# Patient Record
Sex: Female | Born: 1955 | Race: White | Hispanic: No | Smoking: Never smoker
Health system: Southern US, Community
[De-identification: ages and names within clinical notes are randomized; demographics above are authoritative.]

## PROBLEM LIST (undated history)

## (undated) DIAGNOSIS — I10 Essential (primary) hypertension: Secondary | ICD-10-CM

## (undated) DIAGNOSIS — E7800 Pure hypercholesterolemia, unspecified: Secondary | ICD-10-CM

## (undated) DIAGNOSIS — Z902 Acquired absence of lung [part of]: Secondary | ICD-10-CM

## (undated) DIAGNOSIS — F329 Major depressive disorder, single episode, unspecified: Secondary | ICD-10-CM

## (undated) DIAGNOSIS — I313 Pericardial effusion (noninflammatory): Secondary | ICD-10-CM

## (undated) DIAGNOSIS — I3139 Other pericardial effusion (noninflammatory): Secondary | ICD-10-CM

## (undated) DIAGNOSIS — M810 Age-related osteoporosis without current pathological fracture: Secondary | ICD-10-CM

## (undated) DIAGNOSIS — C3490 Malignant neoplasm of unspecified part of unspecified bronchus or lung: Secondary | ICD-10-CM

## (undated) DIAGNOSIS — M419 Scoliosis, unspecified: Secondary | ICD-10-CM

## (undated) DIAGNOSIS — R9431 Abnormal electrocardiogram [ECG] [EKG]: Secondary | ICD-10-CM

## (undated) DIAGNOSIS — Z9889 Other specified postprocedural states: Secondary | ICD-10-CM

## (undated) DIAGNOSIS — F32A Depression, unspecified: Secondary | ICD-10-CM

## (undated) DIAGNOSIS — R911 Solitary pulmonary nodule: Secondary | ICD-10-CM

## (undated) HISTORY — PX: ENDOMETRIAL ABLATION: SHX621

## (undated) HISTORY — DX: Pericardial effusion (noninflammatory): I31.3

## (undated) HISTORY — PX: PLACEMENT OF BREAST IMPLANTS: SHX6334

## (undated) HISTORY — DX: Solitary pulmonary nodule: R91.1

## (undated) HISTORY — DX: Acquired absence of lung (part of): Z90.2

## (undated) HISTORY — DX: Other specified postprocedural states: Z98.890

## (undated) HISTORY — DX: Major depressive disorder, single episode, unspecified: F32.9

## (undated) HISTORY — PX: LUNG LOBECTOMY: SHX167

## (undated) HISTORY — DX: Other pericardial effusion (noninflammatory): I31.39

## (undated) HISTORY — DX: Pure hypercholesterolemia, unspecified: E78.00

## (undated) HISTORY — DX: Depression, unspecified: F32.A

## (undated) HISTORY — DX: Scoliosis, unspecified: M41.9

## (undated) HISTORY — DX: Essential (primary) hypertension: I10

## (undated) HISTORY — DX: Age-related osteoporosis without current pathological fracture: M81.0

## (undated) HISTORY — DX: Abnormal electrocardiogram (ECG) (EKG): R94.31

## (undated) HISTORY — DX: Malignant neoplasm of unspecified part of unspecified bronchus or lung: C34.90

---

## 2001-09-16 HISTORY — PX: AUGMENTATION MAMMAPLASTY: SUR837

## 2017-07-22 ENCOUNTER — Telehealth: Payer: Self-pay | Admitting: *Deleted

## 2017-07-22 NOTE — Telephone Encounter (Signed)
Medical record received from [facility] for pt's upcoming appt  with Dr. Martinique on 08/01/17. Records given to [facility].

## 2017-07-22 NOTE — Telephone Encounter (Signed)
Medical records received from [facility] for upcoming appt.on 11.16.18 . Medical records given to [facility].

## 2017-07-23 ENCOUNTER — Encounter: Payer: Self-pay | Admitting: Internal Medicine

## 2017-07-23 ENCOUNTER — Telehealth: Payer: Self-pay | Admitting: Cardiology

## 2017-07-23 ENCOUNTER — Ambulatory Visit (INDEPENDENT_AMBULATORY_CARE_PROVIDER_SITE_OTHER): Payer: BLUE CROSS/BLUE SHIELD | Admitting: Internal Medicine

## 2017-07-23 ENCOUNTER — Other Ambulatory Visit (INDEPENDENT_AMBULATORY_CARE_PROVIDER_SITE_OTHER): Payer: BLUE CROSS/BLUE SHIELD

## 2017-07-23 VITALS — BP 126/84 | HR 68 | Ht 64.5 in | Wt 130.6 lb

## 2017-07-23 DIAGNOSIS — R058 Other specified cough: Secondary | ICD-10-CM | POA: Insufficient documentation

## 2017-07-23 DIAGNOSIS — R05 Cough: Secondary | ICD-10-CM

## 2017-07-23 DIAGNOSIS — R0609 Other forms of dyspnea: Secondary | ICD-10-CM | POA: Diagnosis not present

## 2017-07-23 LAB — CBC WITH DIFFERENTIAL/PLATELET
BASOS ABS: 0 10*3/uL (ref 0.0–0.1)
BASOS PCT: 0.9 % (ref 0.0–3.0)
EOS ABS: 0.1 10*3/uL (ref 0.0–0.7)
Eosinophils Relative: 1.2 % (ref 0.0–5.0)
HCT: 42.4 % (ref 36.0–46.0)
Hemoglobin: 13.7 g/dL (ref 12.0–15.0)
LYMPHS ABS: 1.7 10*3/uL (ref 0.7–4.0)
LYMPHS PCT: 32.4 % (ref 12.0–46.0)
MCHC: 32.4 g/dL (ref 30.0–36.0)
MCV: 92.8 fl (ref 78.0–100.0)
MONO ABS: 0.4 10*3/uL (ref 0.1–1.0)
Monocytes Relative: 8.3 % (ref 3.0–12.0)
NEUTROS ABS: 3.1 10*3/uL (ref 1.4–7.7)
NEUTROS PCT: 57.2 % (ref 43.0–77.0)
PLATELETS: 242 10*3/uL (ref 150.0–400.0)
RBC: 4.57 Mil/uL (ref 3.87–5.11)
RDW: 12.8 % (ref 11.5–15.5)
WBC: 5.4 10*3/uL (ref 4.0–10.5)

## 2017-07-23 MED ORDER — PANTOPRAZOLE SODIUM 40 MG PO TBEC: 40.0000 mg | DELAYED_RELEASE_TABLET | Freq: Every day | ORAL | 2 refills | Status: DC

## 2017-07-23 MED ORDER — FAMOTIDINE 20 MG PO TABS: ORAL_TABLET | ORAL | 2 refills | Status: DC

## 2017-07-23 NOTE — Progress Notes (Signed)
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   07/23/2017 1st [facility] office visit/ Christine Hatfield   Chief Complaint  Patient presents with  . Pulmonary Consult    Referred by Christine Lento, NP. Pt has hx of lung cancer and had left lower lobectomy in Feb 2016. She c/o DOE with activity such as cleaning her house. She clears her throat often and her voice gets hoarse.   MMRC1 = can walk nl pace, flat grade, can't hurry or go uphills or steps s sob   Sense of pnds ever since surgery 24/7 some better noct but also wakes her up  On fosfamax for > 3 years with overt HB  No obvious day to day or daytime variability or assoc excess/ purulent sputum or mucus plugs or hemoptysis or cp or chest tightness, subjective wheeze or overt sinus   symptoms. No unusual exp hx or h/o childhood pna/ asthma or knowledge of premature birth.    Also denies any obvious fluctuation of symptoms with weather or environmental changes or other aggravating or alleviating factors except as outlined above   Current Allergies, Complete Past Medical History, Past Surgical History, Family History, and Social History were reviewed in [employer] record.  ROS  The following are not active complaints unless bolded Hoarseness, sore throat, dysphagia, dental problems, itching, sneezing,  nasal congestion or discharge of excess mucus or purulent secretions, ear ache,   fever, chills, sweats, unintended wt loss or wt gain, classically pleuritic or exertional cp,  orthopnea pnd or leg swelling, presyncope, palpitations, abdominal pain, anorexia, nausea,  vomiting, diarrhea  or change in bowel habits or change in bladder habits, change in stools or change in urine, dysuria, hematuria,  rash, arthralgias, visual complaints, headache, numbness, weakness or ataxia or problems with walking or coordination,  change in mood/affect or memory.        Current Meds  Medication Sig  . amLODipine (NORVASC) 5 MG tablet Take 1 tablet daily by mouth.  . sertraline (ZOLOFT) 50 MG tablet Take 1 tablet daily by mouth.  . [  alendronate (FOSAMAX) 70 MG tablet Take 1 tablet daily by mouth.               Review of Systems     Objective:   Physical Exam    amb wf with freq throat clearing   Wt Readings from Last 3 Encounters:  07/23/17 130 lb 9.6 oz (59.2 kg)    Vital signs reviewed - Note on arrival 02 sats  99% on RA      HEENT: nl dentition, turbinates bilaterally, and oropharynx which is pristine.  Nl external ear canals without cough reflex   NECK :  without JVD/Nodes/TM/ nl carotid upstrokes bilaterally   LUNGS: no acc muscle use,  Nl contour chest which is clear to A and P bilaterally without cough on insp or exp maneuvers   CV:  RRR  no s3 or murmur or increase in P2, and no edema   ABD:  soft and nontender with nl inspiratory  excursion in the supine position. No bruits or organomegaly appreciated, bowel sounds nl  MS:  Nl gait/ ext warm without deformities, calf tenderness, cyanosis or clubbing No obvious joint restrictions   SKIN: warm and dry without lesions    NEURO:  alert, approp, nl sensorium with  no motor or cerebellar deficits apparent.     Labs ordered 07/23/2017    Allergy profile      Assessment:

## 2017-07-23 NOTE — Patient Instructions (Addendum)
Try off  fosfamax for 3 months duration to judge full effect   Pantoprazole (protonix) 40 mg   Take  30-60 min before first meal of the day and Pepcid (famotidine)  20 mg one @  bedtime until return to office - this is the best way to tell whether stomach acid is contributing to your problem.    GERD (REFLUX)  is an extremely common cause of respiratory symptoms just like yours , many times with no obvious heartburn at all.    It can be treated with medication, but also with lifestyle changes including elevation of the head of your bed (ideally with 6 inch  bed blocks),  Smoking cessation, avoidance of late meals, excessive alcohol, and avoid fatty foods, chocolate, peppermint, colas, red wine, and acidic juices such as orange juice.  NO MINT OR MENTHOL PRODUCTS SO NO COUGH DROPS   USE SUGARLESS CANDY INSTEAD (Jolley ranchers or Stover's or Life Savers) or even ice chips will also do - the key is to swallow to prevent all throat clearing. NO OIL BASED VITAMINS - use powdered substitutes.   For drainage / throat tickle try take CHLORPHENIRAMINE  4 mg - take one every 4 hours as needed - available over the counter- may cause drowsiness so start with just a bedtime dose or two and see how you tolerate it before trying in daytime     Please remember to go to the lab department downstairs in the basement  for your tests - we will call you with the results when they are available.      Please schedule a follow up office visit in 4  weeks, call sooner if needed -add spirometry on return/ consider gabapentin trial next if not better

## 2017-07-23 NOTE — Telephone Encounter (Signed)
07/24/2017 Received faxed referral from [facility] for upcoming appointment with Dr. Martinique on 08/01/2017.  Records given to [facility]. cbr

## 2017-07-24 ENCOUNTER — Encounter: Payer: Self-pay | Admitting: Internal Medicine

## 2017-07-24 DIAGNOSIS — R0609 Other forms of dyspnea: Secondary | ICD-10-CM | POA: Insufficient documentation

## 2017-07-24 LAB — RESPIRATORY ALLERGY PROFILE REGION II ~~LOC~~
Allergen, A. alternata, m6: <0.1 kU/L
Allergen, Comm Silver Birch, t9: <0.1 kU/L
Allergen, D pternoyssinus,d7: <0.1 kU/L
Allergen, P. notatum, m1: <0.1 kU/L
BERMUDA GRASS: 2.5 kU/L — AB
Box Elder IgE: <0.1 kU/L
CLASS: 0
CLASS: 0
CLASS: 0
CLASS: 0
CLASS: 0
CLASS: 0
CLASS: 0
CLASS: 0
CLASS: 0
CLASS: 2
CLASS: 2
COMMON RAGWEED (SHORT) (W1) IGE: 5.75 kU/L — ABNORMAL HIGH
Class: 0
Class: 0
Class: 0
Class: 0
Class: 0
Class: 0
Class: 0
Class: 0
Class: 0
Class: 0
Class: 0
Class: 3
Class: 3
D. farinae: <0.1 kU/L
DOG DANDER: 0.17 kU/L — AB
Elm IgE: <0.1 kU/L
IgE (Immunoglobulin E), Serum: 48 kU/L (ref <=114)
Johnson Grass: 2.46 kU/L — ABNORMAL HIGH
Pecan/Hickory Tree IgE: <0.1 kU/L
TIMOTHY GRASS: 7.47 kU/L — AB

## 2017-07-24 LAB — INTERPRETATION:

## 2017-07-24 NOTE — Progress Notes (Signed)
Spoke with pt and notified of results per Dr. Wert. Pt verbalized understanding and denied any questions. 

## 2017-07-24 NOTE — Assessment & Plan Note (Signed)
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

## 2017-07-24 NOTE — Assessment & Plan Note (Addendum)
Allergy profile 07/23/2017 >  Eos 0.1 /  IgE  Pending  - 07/23/2017 trial off fosfamax/ max gerd rx   The most common causes of chronic cough in immunocompetent adults include the following: upper airway cough syndrome (UACS), previously referred to as postnasal drip syndrome (PNDS), which is caused by variety of rhinosinus conditions; (2) asthma; (3) GERD; (4) chronic bronchitis from cigarette smoking or other inhaled environmental irritants; (5) nonasthmatic eosinophilic bronchitis; and (6) bronchiectasis.   These conditions, singly or in combination, have accounted for up to 94% of the causes of chronic cough in prospective studies.   Other conditions have constituted no >6% of the causes in prospective studies These have included bronchogenic carcinoma, chronic interstitial pneumonia, sarcoidosis, left ventricular failure, ACEI-induced cough, and aspiration from a condition associated with pharyngeal dysfunction.    Chronic cough is often simultaneously caused by more than one condition. A single cause has been found from 38 to 82% of the time, multiple causes from 18 to 62%. Multiply caused cough has been the result of three diseases up to 42% of the time.      Upper airway cough syndrome (previously labeled PNDS),  is so named because it's frequently impossible to sort out how much is  CR/sinusitis with freq throat clearing (which can be related to primary GERD)   vs  causing  secondary (" extra esophageal")  GERD from wide swings in gastric pressure that occur with throat clearing, often  promoting self use of mint and menthol lozenges that reduce the lower esophageal sphincter tone and exacerbate the problem further in a cyclical fashion.   These are the same pts (now being labeled as having "irritable larynx syndrome" by some cough centers) who not infrequently have a history of having failed to tolerate ace inhibitors,  dry powder inhalers or biphosphonates or report having  atypical/extraesophageal reflux symptoms that don't respond to standard doses of PPI  and are easily confused as having aecopd or asthma flares by even experienced allergists/ pulmonologists (myself included).   Of the three most common causes of  Sub-acute or recurrent or chronic cough, only one (GERD)  can actually contribute to/ trigger  the other two (asthma and post nasal drip syndrome)  and perpetuate the cylce of cough.  While not intuitively obvious, many patients with chronic low grade reflux do not cough until there is a primary insult that disturbs the protective epithelial barrier and exposes sensitive nerve endings.   This is typically viral but can be direct physical injury such as with an endotracheal tube.   The point is that once this occurs, it is difficult to eliminate the cycle  using anything but a maximally effective acid suppression regimen at least in the short run, accompanied by an appropriate diet to address non acid GERD and control and add 1st gen H1 blockers per guidelines .   If not better on return next step is trial of gabapentin.   Total time devoted to counseling  > 50 % of initial 60 min office visit:  review case with pt/ discussion of options/alternatives/ personally creating written customized instructions  in presence of pt  then going over those specific  Instructions directly with the pt including how to use all of the meds but in particular covering each new medication in detail and the difference between the maintenance= "automatic" meds and the prns using an action plan format for the latter (If this problem/symptom => do that organization reading Left to right).  Please see AVS from this visit for a full list of these instructions which I personally wrote for this pt and  are unique to this visit.

## 2017-07-28 ENCOUNTER — Other Ambulatory Visit: Payer: Self-pay | Admitting: Registered Nurse

## 2017-07-28 DIAGNOSIS — Z1231 Encounter for screening mammogram for malignant neoplasm of breast: Secondary | ICD-10-CM
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
## 2017-07-30 ENCOUNTER — Encounter: Payer: Self-pay | Admitting: *Deleted

## 2017-08-01 ENCOUNTER — Encounter: Payer: Self-pay | Admitting: Cardiology

## 2017-08-01 ENCOUNTER — Ambulatory Visit (INDEPENDENT_AMBULATORY_CARE_PROVIDER_SITE_OTHER): Payer: BLUE CROSS/BLUE SHIELD | Admitting: Cardiology

## 2017-08-01 VITALS — BP 148/88 | HR 68 | Ht 64.0 in | Wt 134.2 lb

## 2017-08-01 DIAGNOSIS — I3139 Other pericardial effusion (noninflammatory): Secondary | ICD-10-CM

## 2017-08-01 DIAGNOSIS — I313 Pericardial effusion (noninflammatory): Secondary | ICD-10-CM | POA: Diagnosis not present

## 2017-08-01 DIAGNOSIS — I10 Essential (primary) hypertension: Secondary | ICD-10-CM | POA: Diagnosis not present

## 2017-08-01 NOTE — Patient Instructions (Signed)
Schedule Echocardiogram

## 2017-08-05 ENCOUNTER — Ambulatory Visit (HOSPITAL_COMMUNITY): Payer: BLUE CROSS/BLUE SHIELD | Attending: Cardiology

## 2017-08-05 ENCOUNTER — Other Ambulatory Visit: Payer: Self-pay

## 2017-08-05 DIAGNOSIS — I10 Essential (primary) hypertension: Secondary | ICD-10-CM

## 2017-08-05 DIAGNOSIS — E785 Hyperlipidemia, unspecified: Secondary | ICD-10-CM | POA: Diagnosis not present

## 2017-08-05 DIAGNOSIS — I313 Pericardial effusion (noninflammatory): Secondary | ICD-10-CM | POA: Diagnosis present

## 2017-08-05 DIAGNOSIS — I3139 Other pericardial effusion (noninflammatory): Secondary | ICD-10-CM

## 2017-08-20 ENCOUNTER — Ambulatory Visit (INDEPENDENT_AMBULATORY_CARE_PROVIDER_SITE_OTHER): Payer: BLUE CROSS/BLUE SHIELD | Admitting: Internal Medicine

## 2017-08-20 ENCOUNTER — Encounter: Payer: Self-pay | Admitting: Internal Medicine

## 2017-08-20 VITALS — BP 130/86 | HR 68 | Ht 64.0 in | Wt 133.0 lb

## 2017-08-20 DIAGNOSIS — R05 Cough: Secondary | ICD-10-CM

## 2017-08-20 DIAGNOSIS — R058 Other specified cough: Secondary | ICD-10-CM

## 2017-08-20 NOTE — Progress Notes (Signed)
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   History of Present Illness  07/23/2017 1st [facility] office visit/ Beaux Wedemeyer   Chief Complaint  Patient presents with  . Pulmonary Consult    Referred by Aldona Lento, NP. Pt has hx of lung cancer and had left lower lobectomy in Feb 2016. She c/o DOE with activity such as cleaning her house. She clears her throat often and her voice gets hoarse.   MMRC1 = can walk nl pace, flat grade, can't hurry or go uphills or steps s sob   Sense of pnds ever since surgery 24/7 some better noct but also wakes her up  On fosfamax for > 3 years with overt HB rec Try off  fosfamax for 3 months duration to judge full effect  Pantoprazole (protonix) 40 mg   Take  30-60 min before first meal of the day and Pepcid (famotidine)  20 mg one @  bedtime until return to office - this is the best way to tell whether stomach acid is contributing to your problem.   GERD. For drainage / throat tickle try take CHLORPHENIRAMINE  4 mg - take one every 4 hours as needed - available over the counter- may cause drowsiness so start with just a bedtime dose or two and see how you tolerate it before trying in daytime   -add spirometry on return/ consider gabapentin trial next if not better       08/20/2017  f/u ov/Kamyla Olejnik re: uacs ? Related to fosfamax  Chief Complaint  Patient presents with  . Follow-up    Breathing has improved and she is having less cough and PND.    improving ex tol including working out/ cough is much better Take h1 hs and pepcid hs and never started ppi / no fosfamax  since last ov   No obvious day to day or daytime variability or assoc excess/ purulent sputum or mucus plugs or hemoptysis or cp or chest tightness, subjective wheeze or overt sinus or hb symptoms. No unusual exposure hx or h/o childhood pna/ asthma or knowledge of premature birth.  Sleeping ok flat without nocturnal  or early am exacerbation  of respiratory  c/o's or need for noct saba. Also denies any obvious fluctuation of symptoms with weather or environmental changes or other aggravating or alleviating factors except as outlined above   Current Allergies, Complete Past Medical History, Past Surgical History, Family History, and Social History were reviewed in [employer] [facility] record.  ROS  The following are not active complaints unless bolded Hoarseness, sore throat, dysphagia, dental problems, itching, sneezing,  nasal congestion or discharge of excess mucus or purulent secretions, ear ache,   fever, chills, sweats, unintended wt loss or wt gain, classically pleuritic or exertional cp,  orthopnea pnd or leg swelling, presyncope, palpitations, abdominal pain, anorexia, nausea, vomiting, diarrhea  or change in bowel habits or change in bladder habits, change in stools or change in urine, dysuria, hematuria,  rash, arthralgias, visual complaints, headache, numbness, weakness  or ataxia or problems with walking or coordination,  change in mood/affect or memory.        Current Meds  Medication Sig  . amLODipine (NORVASC) 5 MG tablet Take 1 tablet daily by mouth.  . chlorpheniramine (CHLOR-TRIMETON) 4 MG tablet Take 4 mg by mouth every 4 (four) hours as needed for allergies.  . famotidine (PEPCID) 20 MG tablet One at bedtime  . sertraline (ZOLOFT) 50 MG tablet Take 1 tablet daily by mouth.                 Objective:   Physical Exam        Wt Readings from Last 3 Encounters:  08/20/17 133 lb (60.3 kg)  08/01/17 134 lb 3.2 oz (60.9 kg)  07/23/17 130 lb 9.6 oz (59.2  kg)    Vital signs reviewed - Note on arrival 02 sats  95% on RA     amb wf nad/ all smiles/ no longer throat clearing at all     HEENT: nl dentition, turbinates bilaterally, and oropharynx. Nl external ear canals without cough reflex   NECK :  without JVD/Nodes/TM/ nl carotid upstrokes bilaterally   LUNGS: no acc muscle use,  Nl contour chest with minimal decrease bs at L base and mod T scoliosis   CV:  RRR  no s3 or murmur or increase in P2, and no edema   ABD:  soft and nontender with nl inspiratory excursion in the supine position. No bruits or organomegaly appreciated, bowel sounds nl  MS:  Nl gait/ ext warm without deformities, calf tenderness, cyanosis or clubbing No obvious joint restrictions   SKIN: warm and dry without lesions    NEURO:  alert, approp, nl sensorium with  no motor or cerebellar deficits apparent.        Assessment:

## 2017-08-20 NOTE — Patient Instructions (Addendum)
Based on your response to stopping fosfamax I would avoid this class of medications and monitor your bone density per guidelines with alternatives if you need treatment in the future =Prolia or Reclast    Return for unexplained cough or breathing difficulty as needed

## 2017-08-21 ENCOUNTER — Encounter: Payer: Self-pay | Admitting: Internal Medicine

## 2017-08-21 NOTE — Assessment & Plan Note (Addendum)
Allergy profile 07/23/2017 >  Eos 0.1 /  IgE  48  RAST pos grass, ragweed, dog > rec avoidance only  - 07/23/2017 trial off fosfamax/ max gerd rx > only uses h1 h2 hs and off fosfamax > cough resolved   Better to her satisfaction on minimal rx for gerd now that she's off fosfamax and so can probably d/c the hs h2 and just use prn 1st gen H1 blockers up to q 4h prn   I had an extended final summary discussion with the patient reviewing all relevant studies completed to date and  lasting 15 to 20 minutes of a 25 minute visit on the following issues:   Reviewed alternatives to oral biphosphonates and will need to resume surveillance for worsening bone density with prolia twice yearly or once yearly reclast reasonable alternatives  Moving to [facility] permanently this coming year so promised to set up f/u there.  If cough recurs should resume full gerd rx as prev rec x 6 weeks prior to re-eval by PCP or pulmonary in [place].  Pulmonary f/u is prn   Each maintenance medication was reviewed in detail including most importantly the difference between maintenance and as needed and under what circumstances the prns are to be used.  Please see AVS for specific  Instructions which are unique to this visit and I personally typed out  which were reviewed in detail in writing with the patient and a copy provided.

## 2017-08-26 ENCOUNTER — Ambulatory Visit: Payer: BLUE CROSS/BLUE SHIELD

## 2017-09-11 ENCOUNTER — Other Ambulatory Visit: Payer: Self-pay | Admitting: Internal Medicine

## 2017-09-11 DIAGNOSIS — R058 Other specified cough: Secondary | ICD-10-CM

## 2017-09-11 DIAGNOSIS — R05 Cough: Secondary | ICD-10-CM

## 2017-09-11 MED ORDER — FAMOTIDINE 20 MG PO TABS: ORAL_TABLET | ORAL | 0 refills | Status: AC

## 2017-09-19 ENCOUNTER — Other Ambulatory Visit: Payer: Self-pay | Admitting: Internal Medicine

## 2017-10-01 ENCOUNTER — Ambulatory Visit
Admission: RE | Admit: 2017-10-01 | Discharge: 2017-10-01 | Disposition: A | Discharge status: Home / Self Care | Payer: BLUE CROSS/BLUE SHIELD | Source: Ambulatory Visit | Attending: Registered Nurse | Admitting: Registered Nurse

## 2017-10-01 DIAGNOSIS — Z1231 Encounter for screening mammogram for malignant neoplasm of breast: Secondary | ICD-10-CM

## 2018-10-14 IMAGING — MG DIGITAL SCREENING BILATERAL MAMMOGRAM WITH IMPLANTS AND CAD
8 series · 8 of 8 positions shown · non-contrast
Comparison: Previous exam(s).

CLINICAL DATA: Screening.

EXAM:
DIGITAL SCREENING BILATERAL MAMMOGRAM WITH IMPLANTS AND CAD
The patient has retropectoral implants. Standard and implant
displaced views were performed.

[L CC (1 of 2)]
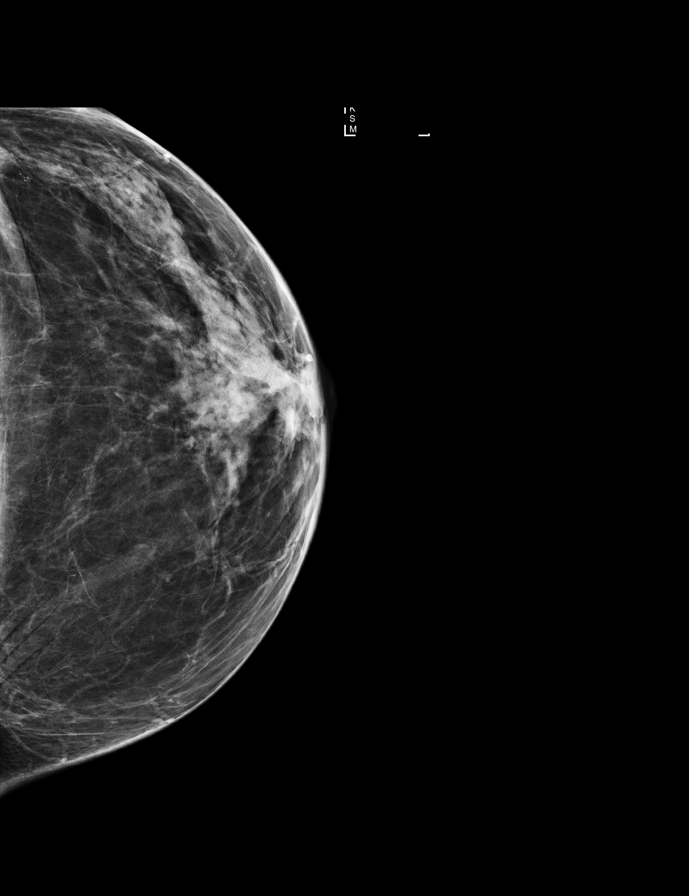

[R MLO (1 of 2)]
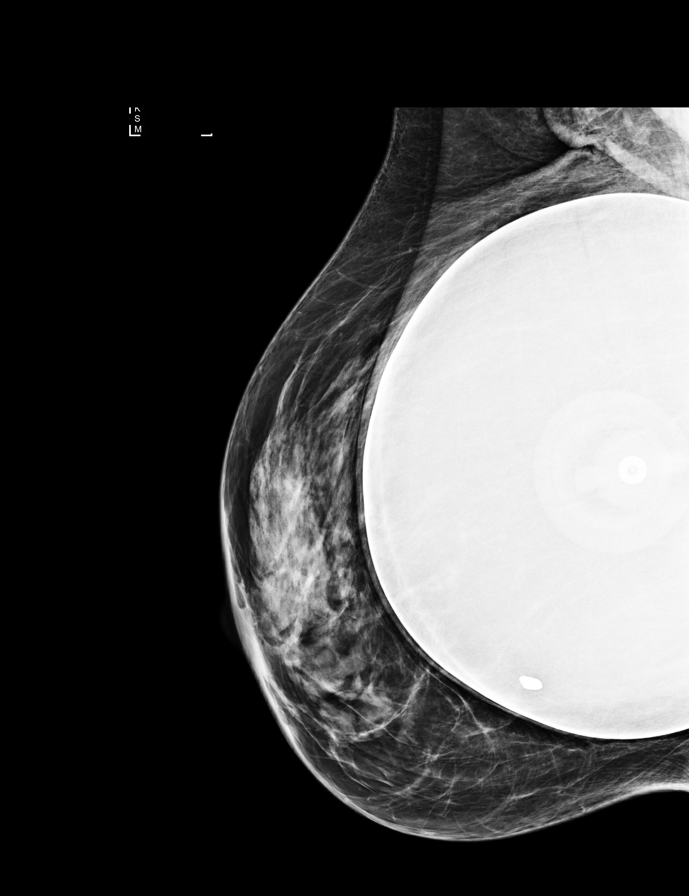

[L MLO (1 of 2)]
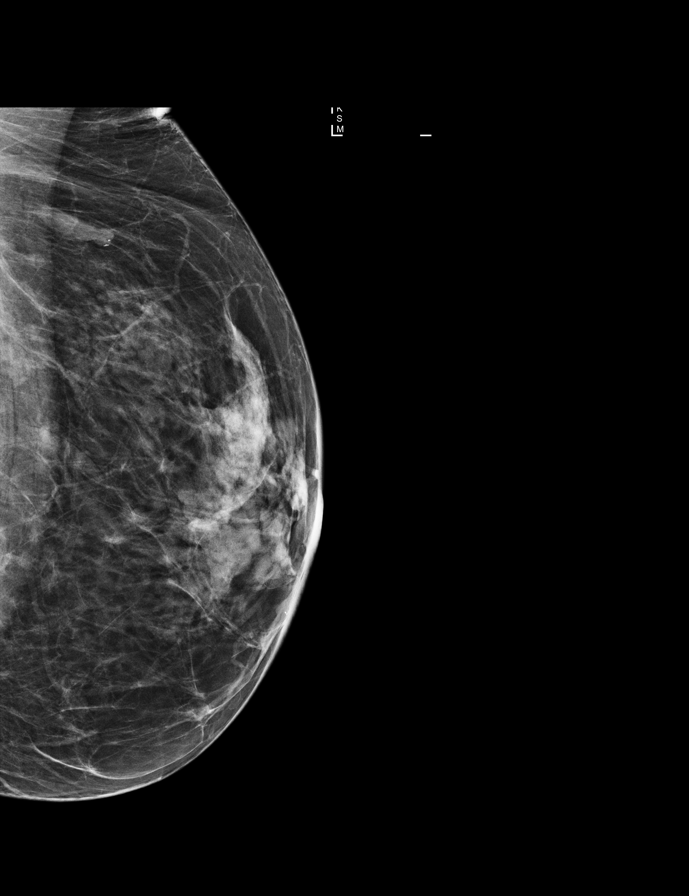

[L MLO (2 of 2)]
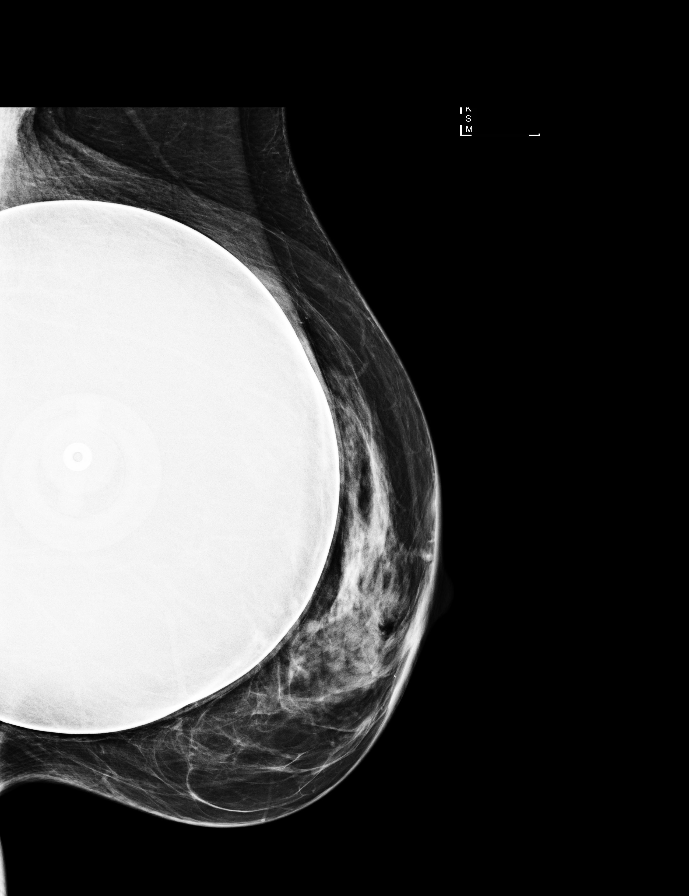

[L CC (2 of 2)]
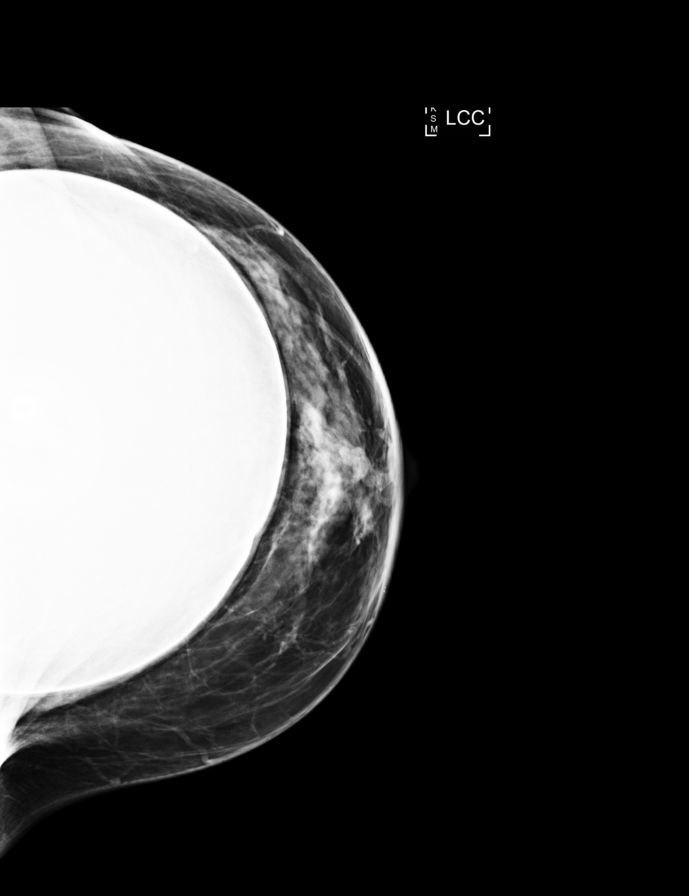

[R CC (1 of 2)]
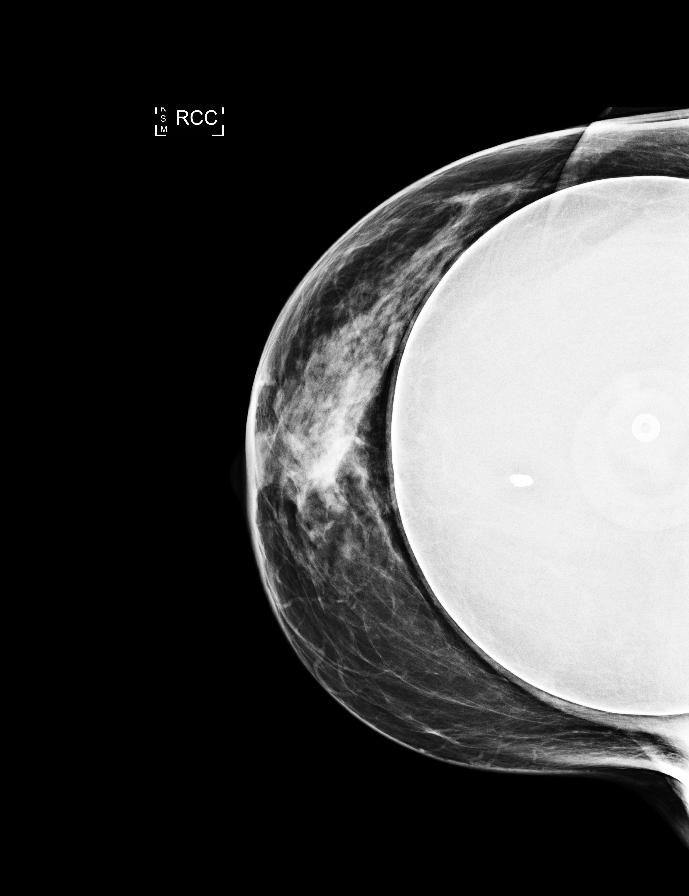

[R MLO (2 of 2)]
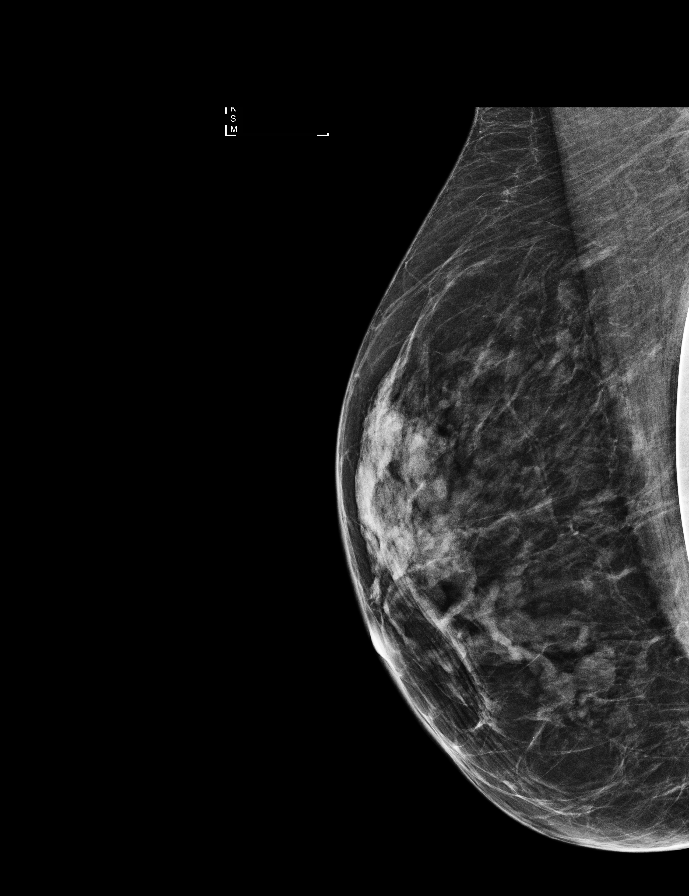

[R CC (2 of 2)]
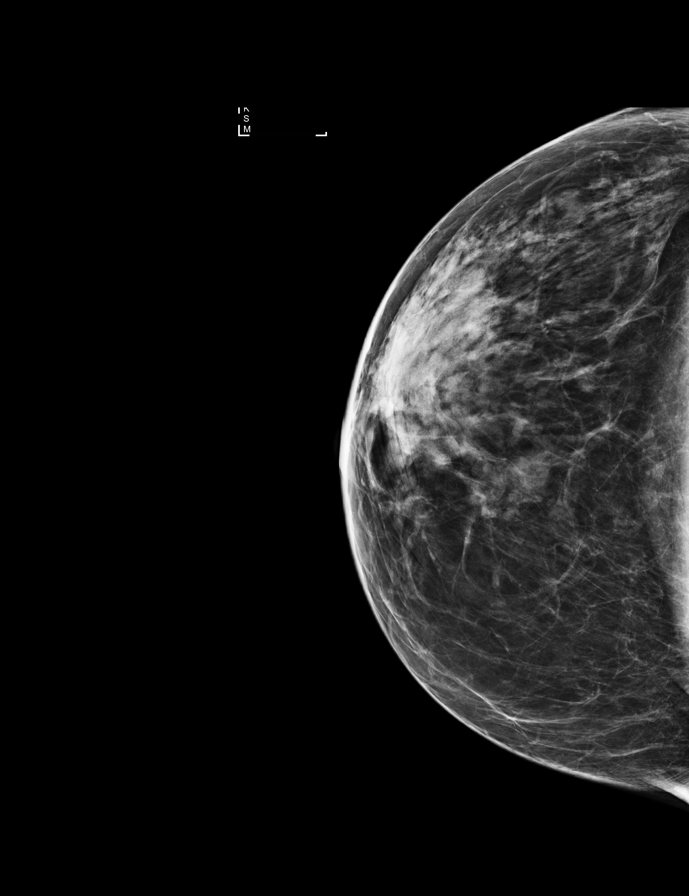

[8 of 8 positions shown; findings below may reference images not displayed]

ACR Breast Density Category c: The breast tissue is heterogeneously
dense, which may obscure small masses.
FINDINGS: There are no findings suspicious for malignancy. Images were
processed with CAD.
IMPRESSION: No mammographic evidence of malignancy. A result letter of this
screening mammogram will be mailed directly to the patient.

RECOMMENDATION:
Screening mammogram in one year. (Code:R9-8-RFM)

BI-RADS CATEGORY  1:  Negative.

## 2020-05-30 ENCOUNTER — Telehealth: Payer: Self-pay | Admitting: *Deleted

## 2020-05-30 NOTE — Telephone Encounter (Signed)
I received referral on Christine Hatfield.  I am unclear of the reason for referral.  I called her for clarification but was unable to reach her.  I did leave vm message with my name and phone number to call.

## 2020-06-02 ENCOUNTER — Telehealth: Payer: Self-pay | Admitting: *Deleted

## 2020-06-02 DIAGNOSIS — R911 Solitary pulmonary nodule: Secondary | ICD-10-CM

## 2020-06-02 NOTE — Telephone Encounter (Signed)
I received referral on Ms. Christine Hatfield. I called patient and updated her on appt time and place.  She verbalized understanding of appt.

## 2020-06-20 ENCOUNTER — Encounter: Payer: Self-pay | Admitting: Internal Medicine

## 2020-06-20 ENCOUNTER — Other Ambulatory Visit: Payer: Self-pay

## 2020-06-20 ENCOUNTER — Inpatient Hospital Stay: Payer: BLUE CROSS/BLUE SHIELD

## 2020-06-20 ENCOUNTER — Inpatient Hospital Stay: Payer: BLUE CROSS/BLUE SHIELD | Attending: Internal Medicine | Admitting: Internal Medicine

## 2020-06-20 DIAGNOSIS — C3492 Malignant neoplasm of unspecified part of left bronchus or lung: Secondary | ICD-10-CM

## 2020-06-20 DIAGNOSIS — Z803 Family history of malignant neoplasm of breast: Secondary | ICD-10-CM | POA: Insufficient documentation

## 2020-06-20 DIAGNOSIS — C3490 Malignant neoplasm of unspecified part of unspecified bronchus or lung: Secondary | ICD-10-CM

## 2020-06-20 DIAGNOSIS — E785 Hyperlipidemia, unspecified: Secondary | ICD-10-CM | POA: Insufficient documentation

## 2020-06-20 DIAGNOSIS — Z85118 Personal history of other malignant neoplasm of bronchus and lung: Secondary | ICD-10-CM | POA: Insufficient documentation

## 2020-06-20 DIAGNOSIS — Z902 Acquired absence of lung [part of]: Secondary | ICD-10-CM | POA: Insufficient documentation

## 2020-06-20 DIAGNOSIS — I10 Essential (primary) hypertension: Secondary | ICD-10-CM

## 2020-06-20 DIAGNOSIS — Z800 Family history of malignant neoplasm of digestive organs: Secondary | ICD-10-CM | POA: Insufficient documentation

## 2020-06-20 DIAGNOSIS — R059 Cough, unspecified: Secondary | ICD-10-CM | POA: Insufficient documentation

## 2020-06-20 DIAGNOSIS — F329 Major depressive disorder, single episode, unspecified: Secondary | ICD-10-CM | POA: Insufficient documentation

## 2020-06-20 DIAGNOSIS — Z79899 Other long term (current) drug therapy: Secondary | ICD-10-CM | POA: Insufficient documentation

## 2020-06-20 DIAGNOSIS — R911 Solitary pulmonary nodule: Secondary | ICD-10-CM

## 2020-06-20 LAB — CBC WITH DIFFERENTIAL (CANCER CENTER ONLY)
Abs Immature Granulocytes: 0.01 10*3/uL (ref 0.00–0.07)
Basophils Absolute: 0 10*3/uL (ref 0.0–0.1)
Basophils Relative: 1 %
Eosinophils Absolute: 0.1 10*3/uL (ref 0.0–0.5)
Eosinophils Relative: 1 %
HCT: 42.7 % (ref 36.0–46.0)
Hemoglobin: 13.9 g/dL (ref 12.0–15.0)
Immature Granulocytes: 0 %
Lymphocytes Relative: 30 %
Lymphs Abs: 1.5 10*3/uL (ref 0.7–4.0)
MCH: 29.5 pg (ref 26.0–34.0)
MCHC: 32.6 g/dL (ref 30.0–36.0)
MCV: 90.7 fL (ref 80.0–100.0)
Monocytes Absolute: 0.4 10*3/uL (ref 0.1–1.0)
Monocytes Relative: 8 %
Neutro Abs: 3 10*3/uL (ref 1.7–7.7)
Neutrophils Relative %: 60 %
Platelet Count: 252 10*3/uL (ref 150–400)
RBC: 4.71 MIL/uL (ref 3.87–5.11)
RDW: 11.9 % (ref 11.5–15.5)
WBC Count: 5.1 10*3/uL (ref 4.0–10.5)
nRBC: 0 % (ref 0.0–0.2)

## 2020-06-20 LAB — CMP (CANCER CENTER ONLY)
ALT: 14 U/L (ref 0–44)
AST: 20 U/L (ref 15–41)
Albumin: 4.3 g/dL (ref 3.5–5.0)
Alkaline Phosphatase: 66 U/L (ref 38–126)
Anion gap: 6 (ref 5–15)
BUN: 14 mg/dL (ref 8–23)
CO2: 31 mmol/L (ref 22–32)
Calcium: 10 mg/dL (ref 8.9–10.3)
Chloride: 102 mmol/L (ref 98–111)
Creatinine: 0.85 mg/dL (ref 0.44–1.00)
GFR, Estimated: >60 mL/min (ref >60)
Glucose, Bld: 79 mg/dL (ref 70–99)
Potassium: 3.9 mmol/L (ref 3.5–5.1)
Sodium: 139 mmol/L (ref 135–145)
Total Bilirubin: 0.5 mg/dL (ref 0.3–1.2)
Total Protein: 7.4 g/dL (ref 6.5–8.1)

## 2020-06-20 NOTE — Progress Notes (Signed)
[place] [phone]   [phone]  CONSULT NOTE  REFERRING PHYSICIAN: Dr. Christinia Gully  REASON FOR CONSULTATION:  64 years old white female with history of lung cancer.  HPI Christine Hatfield is a 64 y.o. female with past medical history significant for hypertension, dyslipidemia, osteoporosis, scoliosis, depression as well as spinal surgery and history of stage Ia non-small cell lung cancer, adenocarcinoma diagnosed during work-up for kidney stone in late 2015.  The patient had surgery in January 2016 including left lower lobectomy with lymph node sampling at [facility] in [place].  She did not receive any adjuvant systemic chemotherapy or radiation because of the early stage disease.  The patient moved to [place] and she was followed by medical oncology there and had repeat imaging studies at regular basis.  The last CT scan of the chest was performed on 03/27/2020 and it showed stable postsurgical changes from the left lower lobectomy without evidence for locally recurrent disease or definite evidence for metastatic disease.  There was development of a subcentimeter groundglass nodule measuring 7 mm within the posterior left upper lobe favored to be infectious/inflammatory in etiology but close follow-up was advised.  The patient moved recently to [facility] and she was seen by Dr. Melvyn Novas to establish care with a pulmonologist.  I referred her to me today for evaluation and recommendation regarding her history of lung cancer and also close monitoring of the new findings on the last scan. When seen today she is feeling fine with no concerning complaints except for some emotional distress from moving back to [place] and broken relationship.  She denied having any current chest pain, shortness of breath but has mild cough with no hemoptysis.  She denied having any recent weight loss or night sweats.  She has no nausea, vomiting, diarrhea or  constipation.  She denied having any headache or visual changes.  She has no fever or chills. Family history significant for father with colon cancer and he still alive at age 54.  She does not know much about her mother's medical history.  She had a paternal grandmother with breast cancer. The patient is single and has no children.  She used to work in Database administrator.  She has a remote history of 1 year for smoking during college but none since that time.  She also likes to drink wine at regular basis.  She has no history of drug abuse.  HPI  Past Medical History:  Diagnosis Date  . Abnormal EKG   . Depression   . History of colonoscopy   . History of lobectomy of lung   . Hypercholesterolemia   . Hypertension   . Osteoporosis   . Osteoporosis   . Pericardial effusion   . Pericardial effusion   . 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 Pulmonary nodule   . Scoliosis     Past Surgical History:  Procedure Laterality Date  . AUGMENTATION MAMMAPLASTY Bilateral 2003  . ENDOMETRIAL ABLATION     D&C  . LUNG LOBECTOMY Left   . PLACEMENT OF BREAST IMPLANTS      No family history on file.  Social History Social History   Tobacco Use  . Smoking status: Never Smoker  . Smokeless tobacco: Never Used  Vaping Use  . Vaping Use: Never used  Substance Use Topics  . Alcohol use: Not on file  . Drug use: Not on file    Allergies  Allergen Reactions  .  Penicillins Anaphylaxis    Current Outpatient Medications  Medication Sig Dispense Refill  . amLODipine (NORVASC) 5 MG tablet Take 1 tablet daily by mouth.    . chlorpheniramine (CHLOR-TRIMETON) 4 MG tablet Take 4 mg by mouth every 4 (four) hours as needed for allergies.    . famotidine (PEPCID) 20 MG tablet One at bedtime 90 tablet 0  . sertraline (ZOLOFT) 50 MG tablet Take 1 tablet daily by mouth.     No current facility-administered medications for this visit.    Review of Systems  Constitutional:  negative Eyes: negative Ears, nose, mouth, throat, and face: negative Respiratory: positive for cough Cardiovascular: negative Gastrointestinal: negative Genitourinary:negative Integument/breast: negative Hematologic/lymphatic: negative Musculoskeletal:negative Neurological: negative Behavioral/Psych: positive for depression Endocrine: negative Allergic/Immunologic: negative  Physical Exam  ZOX:WRUEA, healthy, no distress, well nourished and well developed SKIN: skin color, texture, turgor are normal, no rashes or significant lesions HEAD: Normocephalic, No masses, lesions, tenderness or abnormalities EYES: normal, PERRLA, Conjunctiva are pink and non-injected EARS: External ears normal, Canals clear OROPHARYNX:no exudate, no erythema and lips, buccal mucosa, and tongue normal  NECK: supple, no adenopathy, no JVD LYMPH:  no palpable lymphadenopathy, no hepatosplenomegaly BREAST:not examined LUNGS: clear to auscultation , and palpation HEART: regular rate & rhythm, no murmurs and no gallops ABDOMEN:abdomen soft, non-tender, normal bowel sounds and no masses or organomegaly BACK: No CVA tenderness, Range of motion is normal EXTREMITIES:no joint deformities, effusion, or inflammation, no edema  NEURO: alert & oriented x 3 with fluent speech, no focal motor/sensory deficits  PERFORMANCE STATUS: ECOG 1  LABORATORY DATA: Lab Results  Component Value Date   WBC 5.1 06/20/2020   HGB 13.9 06/20/2020   HCT 42.7 06/20/2020   MCV 90.7 06/20/2020   PLT 252 06/20/2020      Chemistry      Component Value Date/Time   NA 139 06/20/2020 1407   K 3.9 06/20/2020 1407   CL 102 06/20/2020 1407   CO2 31 06/20/2020 1407   BUN 14 06/20/2020 1407   CREATININE 0.85 06/20/2020 1407      Component Value Date/Time   CALCIUM 10.0 06/20/2020 1407   ALKPHOS 66 06/20/2020 1407   AST 20 06/20/2020 1407   ALT 14 06/20/2020 1407   BILITOT 0.5 06/20/2020 1407       RADIOGRAPHIC  STUDIES: No results found.  ASSESSMENT: This is a very pleasant 64 years old white female diagnosed with a stage Ia (T1 a, N0, M0) non-small cell lung cancer, adenocarcinoma in January 2016 status post left lower lobectomy with lymph node sampling at [facility] in [place].   PLAN: I had a lengthy discussion with the patient today about her current condition and recommendation for her lung cancer.  She had close monitoring over the last 5 years with no concerning findings but the CT scan of the chest on 03/27/2020 showed a 7 mm posterior left upper lobe nodule likely inflammatory in origin but close monitoring is recommended. I recommended for the patient to have repeat CT scan of the chest with contrast in around 3 months for further evaluation of this nodule and to rule out any disease recurrence. For the hypertension, depression and dyslipidemia, the patient will continue her routine follow-up visit and evaluation by her primary care physician Dr. Maudie Mercury. She was advised to call immediately if she has any concerning symptoms in the interval. The patient voices understanding of current disease status and treatment options and is in agreement with the current care plan.  All questions were answered. The patient knows to call the clinic with any problems, questions or concerns. We can certainly see the patient much sooner if necessary.  Thank you so much for allowing me to participate in the care of Christine Hatfield. I will continue to follow up the patient with you and assist in her care. The total time spent in the appointment was 60 minutes.  Disclaimer: This note was dictated with voice recognition software. Similar sounding words can inadvertently be transcribed and may not be corrected upon review.   Eilleen Kempf June 20, 2020, 2:29 PM

## 2020-06-21 ENCOUNTER — Telehealth: Payer: Self-pay | Admitting: Internal Medicine

## 2020-06-21 ENCOUNTER — Telehealth: Payer: Self-pay | Admitting: *Deleted

## 2020-06-21 NOTE — Telephone Encounter (Signed)
I received a call from Ms. Christine Hatfield.  She was confused about her CT scan.  I called her back but was unable to reach her. I did leave a vm message updating that her scan should be on 12/28 or 12/29 before she sees Dr. Julien Nordmann on 12/30.  I explained that central scheduling will call her with the appt closer to December. I also left my name and phone number to call if needed.

## 2020-06-21 NOTE — Telephone Encounter (Signed)
Scheduled appt per 10/5 los- mailed reminder letter with appt date and time

## 2020-06-22 ENCOUNTER — Telehealth: Payer: Self-pay | Admitting: *Deleted

## 2020-06-22 NOTE — Telephone Encounter (Signed)
Christine Hatfield called me today.  She was thankful for the call and update I gave her yesterday.  She states will be getting her CD of her scans and will bring them to me on her next lab appt.

## 2020-08-21 ENCOUNTER — Other Ambulatory Visit: Payer: Self-pay | Admitting: Internal Medicine

## 2020-08-21 DIAGNOSIS — N6489 Other specified disorders of breast: Secondary | ICD-10-CM

## 2020-09-01 ENCOUNTER — Telehealth: Payer: Self-pay

## 2020-09-01 NOTE — Telephone Encounter (Signed)
Pt has been r/s for [facility] for Monday 09/04/20 with a 12p arrival time.  I have spoken with the pt and advised of this. She has agreed to the appt. NPO 4hrs before. Rx's and water okay to take.   The following address and contact information was also provided to the pt should she need to make any changes.  [place], [place]

## 2020-09-01 NOTE — Telephone Encounter (Signed)
-----   Message from Roosvelt Maser sent at 09/01/2020 12:52 PM EST ----- Regarding: RE: Facility Appt for [facility] will be cancelled.  We do not schedule for GI you will need to call them [phone]  Vivien Rota  ----- Message ----- From: Elliot Gault Sent: 09/01/2020  12:30 PM EST To: April H Pait, Roosvelt Maser, # Subject: Facility                                       Good afternoon - I was able to get the patients scans approved, however, they would not approve them to be done at [facility] The patient will need to go to GI. Please rs with GI.  Thank you  Velna Hatchet

## 2020-09-03 NOTE — Progress Notes (Signed)
[name] OFFICE PROGRESS NOTE  Christine Gravel, MD [address] [place] [address]  DIAGNOSIS: Stage Ia (T1 a, N0, M0) non-small cell lung cancer, adenocarcinoma in January 2016   PRIOR THERAPY: Status post left lower lobectomy with lymph node sampling at [facility] in [place].  CURRENT THERAPY: Observation  INTERVAL HISTORY: Christine Hatfield 64 y.o. female returns to the clinic today for a follow-up visit. She had a history of stage Ia non-small cell lung cancer, adenocarcinoma which was diagnosed in [place], [place] in late 2015.  The patient recently moved to this area. She establish care with pulmonologist, Dr. Melvyn Novas.  She had a routine CT scan of the chest performed in July 2021 which showed a 7 mm posterior left upper lobe nodule likely inflammatory in origin but close monitoring was recommended. She was first seen my our clinic in October 2021.   Today, the patient is feeling fairly well she denies any fever, chills, or weight loss.  She does report occasional night sweats. She denies any shortness of breath, cough, hemoptysis, or chest pain. She reports occasional sinus drainage. She denies any nausea, vomiting, diarrhea, or constipation.  She denies any headaches. She reports she feels like she needs to see her eye doctor for visual changes with reading computer screens.  The patient recently had a restaging CT scan performed.  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  MEDICATIONS:  Current Outpatient Medications   Medication Sig Dispense Refill   amLODipine (NORVASC) 5 MG tablet Take 1 tablet daily by mouth.     chlorpheniramine (CHLOR-TRIMETON) 4 MG tablet Take 4 mg by mouth every 4 (four) hours as needed for allergies.     famotidine (PEPCID) 20 MG tablet One at bedtime 90 tablet 0   sertraline (ZOLOFT) 50 MG tablet Take 1 tablet daily by mouth.     No current facility-administered medications for this visit.    SURGICAL HISTORY:  Past Surgical History:  Procedure Laterality Date   AUGMENTATION MAMMAPLASTY Bilateral 2003   ENDOMETRIAL ABLATION     D&C   LUNG LOBECTOMY Left    PLACEMENT OF BREAST IMPLANTS      REVIEW OF SYSTEMS:   Review of Systems  Constitutional: Negative for appetite change, chills, fatigue, fever and unexpected weight change.  HENT: Positive for sinus drainage. Negative for mouth sores, nosebleeds, sore throat and trouble swallowing.   Eyes: Negative for eye problems and icterus.  Respiratory: Negative for cough, hemoptysis, shortness of breath and wheezing.   Cardiovascular: Negative for chest pain and leg swelling.  Gastrointestinal: Negative for abdominal pain, constipation, diarrhea, nausea and vomiting.  Genitourinary: Negative for bladder incontinence, difficulty urinating, dysuria, frequency and hematuria.   Musculoskeletal: Negative for back pain, gait problem, neck pain and neck stiffness.  Skin: Negative for itching and rash.  Neurological: Negative for dizziness, extremity weakness, gait problem, headaches, light-headedness and seizures.  Hematological: Negative for adenopathy. Does not bruise/bleed easily.  Psychiatric/Behavioral: Negative for confusion, depression and sleep disturbance. The patient is not nervous/anxious.     PHYSICAL EXAMINATION:  Blood pressure 140/89, pulse 67, temperature 98.9 F (37.2 C), temperature source Tympanic, resp. rate 14, height 5\' 4"  (1.626 m), weight 128 lb 1.6 oz (58.1 kg), SpO2 100 %.  ECOG PERFORMANCE  STATUS: 1 - Symptomatic but completely ambulatory  Physical Exam  Constitutional: Oriented to person, place, and time and well-developed, well-nourished, and in no distress.  HENT:  Head: Normocephalic and atraumatic.  Mouth/Throat: Oropharynx is clear and moist. No oropharyngeal exudate.  Eyes: Conjunctivae are normal. Right eye exhibits no discharge. Left eye exhibits no discharge. No scleral icterus.  Neck: Normal range of motion. Neck supple.  Cardiovascular: Normal rate, regular rhythm, normal heart sounds and intact distal pulses.   Pulmonary/Chest: Effort normal and breath sounds normal. No respiratory distress. No wheezes. No rales.  Abdominal: Soft. Bowel sounds are normal. Exhibits no distension and no mass. There is no tenderness.  Musculoskeletal: Normal range of motion. Exhibits no edema.  Lymphadenopathy:    No cervical adenopathy.  Neurological: Alert and oriented to person, place, and time. Exhibits normal muscle tone. Gait normal. Coordination normal.  Skin: Skin is warm and dry. No rash noted. Not diaphoretic. No erythema. No pallor.  Psychiatric: Mood, memory and judgment normal.  Vitals reviewed.  LABORATORY DATA: Lab Results  Component Value Date   WBC 5.0 09/04/2020   HGB 14.5 09/04/2020   HCT 44.0 09/04/2020   MCV 92.1 09/04/2020   PLT 254 09/04/2020      Chemistry      Component Value Date/Time   NA 142 09/04/2020 1103   K 3.8 09/04/2020 1103   CL 106 09/04/2020 1103   CO2 29 09/04/2020 1103   BUN 13 09/04/2020 1103   CREATININE 0.93 09/04/2020 1103      Component Value Date/Time   CALCIUM 9.5 09/04/2020 1103   ALKPHOS 67 09/04/2020 1103   AST 22 09/04/2020 1103   ALT 17 09/04/2020 1103   BILITOT 0.6 09/04/2020 1103       RADIOGRAPHIC STUDIES:  CT Chest W Contrast  Result Date: 09/04/2020 CLINICAL DATA:  Non-small cell lung cancer staging, follow-up evaluation in a patient with history of LEFT lower lobectomy for stage I a non-small cell  lung cancer by report diagnosed in 2015. EXAM: CT CHEST WITH CONTRAST TECHNIQUE: Multidetector CT imaging of the chest was performed during intravenous contrast administration. CONTRAST:  35mL ISOVUE-300 IOPAMIDOL (ISOVUE-300) INJECTION 61% COMPARISON:  None available FINDINGS: Cardiovascular: Calcified atheromatous plaque scattered throughout the thoracic aorta. Ascending thoracic aortic caliber 3.5 cm. Central pulmonary vasculature of normal caliber with alteration of anatomy secondary to LEFT lower lobectomy. Heart size is normal without pericardial effusion. Mediastinum/Nodes: Esophagus mildly patulous. Thoracic inlet structures are normal. No axillary lymphadenopathy. No mediastinal lymphadenopathy. No hilar lymphadenopathy. Lungs/Pleura: Post LEFT lower lobectomy. Minimal subpleural reticulation in the RIGHT chest about the periphery of the RIGHT lower lobe. Subtle nodular ground-glass in the medial RIGHT middle lobe, largest area is approximately 6 x 4 mm. There is some motion in this area the does limit assessment. LEFT upper lobe nodule (image 90, series 8) 8 x 4 mm. No effusion. No consolidative changes. Upper Abdomen: Hepatic cyst. No acute upper abdominal process. Imaged portions of liver, spleen, pancreas and adrenal glands are normal. Upper pole of LEFT and RIGHT kidney unremarkable. Musculoskeletal: No acute musculoskeletal finding. Rotary dextroconvex scoliotic curvature in the upper lumbar spine is moderate and associated with  degenerative changes, partially imaged. IMPRESSION: 1. Post LEFT lower lobectomy. 2. Subtle nodular areas with ground-glass features in the medial RIGHT middle lobe and in the anterior LEFT upper lobe in the mid chest. Findings could be post infectious or inflammatory though there are no prior images available for current comparison and these areas remain indeterminate. Short interval follow-up in 3 months and/or comparison with previous imaging may be helpful for further  assessment. 3. Atherosclerosis. Aortic Atherosclerosis (ICD10-I70.0). Electronically Signed   By: Zetta Bills M.D.   On: 09/04/2020 14:36     ASSESSMENT/PLAN:  This is a very pleasant 64 year old Caucasian female diagnosed with a stage Ia (T1 a, N0, M0) non-small cell lung cancer, adenocarcinoma in January 2016 status post left lower lobectomy with lymph node sampling at [facility] in [place].  The patient's last CT scan was on 03/27/2020 which showed a 7 mm posterior left upper lobe nodule likely inflammatory in origin but close monitoring was recommended.  The patient recently had a restaging CT scan of the chest performed.  Dr. Julien Nordmann personally independently reviewed the scan and discussed the results with the patient today.  The scan showed subtle nodular areas with ground-glass features in the medial RIGHT middle lobe and in the anterior LEFT upper lobe in the mid chest. Findings could be post infectious or inflammatory though there are no prior images available for current comparison and these areas remain indeterminate. Dr. Julien Nordmann recommends follow up CT imaging of the chest.   The patient is moving to [place], [place]. She will need to establish care with oncology locally for close monitoring of these nodules with follow up CT scans. We will refer her to Dr. Philmore Pali. We will defer the decision to him for when he would recommend follow up imaging. Dr. Julien Nordmann recommends follow up imaging in about 6 months.   We will not schedule any follow up appointments at this time but would be happy to see her back in the future if she is to re-establish care locally.   The patient was advised to call immediately if she has any concerning symptoms in the interval. The patient voices understanding of current disease status and treatment options and is in agreement with the current care plan. All questions were answered. The patient knows to call the clinic with any problems,  questions or concerns. We can certainly see the patient much sooner if necessary     Orders Placed This Encounter  Procedures   Ambulatory referral to Hematology / Oncology    Referral Priority:   Routine    Referral Type:   Consultation    Referral Reason:   Specialty Services Required    Referred to Provider:   Caryl Pina, MD    Number of Visits Requested:   [place], PA-C 09/05/20  ADDENDUM: Hematology/Oncology Attending: I had a face-to-face encounter with the patient today.  I recommended her care plan.  This is a very pleasant 64 years old white female with stage Ia non-small cell lung cancer, adenocarcinoma diagnosed in January 2016 status post left lower lobectomy with lymph node sampling in the [facility] in [place].  The patient has been in observation for several years. She had repeat CT scan of the chest performed recently that showed no concerning findings for disease recurrence or metastasis but it showed persistent groundglass opacity suspicious for inflammatory process but low-grade adenocarcinoma could not be completely excluded. The patient is  moving to [place] to start a new job. I recommended her to continue on observation and we will make a referral for her to see Dr. Philmore Pali in [place] after she moves there. She may need repeat scan of the chest in another 6 months to rule out any progression of the groundglass opacities. The patient was advised to call if she has any other concerning symptoms in the interval. Disclaimer: This note was dictated with voice recognition software. Similar sounding words can inadvertently be transcribed and may be missed upon review. Eilleen Kempf, MD 09/05/20

## 2020-09-04 ENCOUNTER — Ambulatory Visit
Admission: RE | Admit: 2020-09-04 | Discharge: 2020-09-04 | Disposition: A | Discharge status: Home / Self Care | Payer: Managed Care, Other (non HMO) | Source: Ambulatory Visit | Attending: Internal Medicine | Admitting: Internal Medicine

## 2020-09-04 ENCOUNTER — Other Ambulatory Visit: Payer: Self-pay

## 2020-09-04 ENCOUNTER — Inpatient Hospital Stay: Payer: Managed Care, Other (non HMO) | Attending: Internal Medicine

## 2020-09-04 DIAGNOSIS — I10 Essential (primary) hypertension: Secondary | ICD-10-CM | POA: Insufficient documentation

## 2020-09-04 DIAGNOSIS — C3490 Malignant neoplasm of unspecified part of unspecified bronchus or lung: Secondary | ICD-10-CM

## 2020-09-04 DIAGNOSIS — Z79899 Other long term (current) drug therapy: Secondary | ICD-10-CM | POA: Insufficient documentation

## 2020-09-04 DIAGNOSIS — Z902 Acquired absence of lung [part of]: Secondary | ICD-10-CM | POA: Diagnosis not present

## 2020-09-04 DIAGNOSIS — Z85118 Personal history of other malignant neoplasm of bronchus and lung: Secondary | ICD-10-CM | POA: Insufficient documentation

## 2020-09-04 DIAGNOSIS — M810 Age-related osteoporosis without current pathological fracture: Secondary | ICD-10-CM | POA: Diagnosis not present

## 2020-09-04 DIAGNOSIS — R911 Solitary pulmonary nodule: Secondary | ICD-10-CM | POA: Insufficient documentation

## 2020-09-04 DIAGNOSIS — E7800 Pure hypercholesterolemia, unspecified: Secondary | ICD-10-CM | POA: Diagnosis not present

## 2020-09-04 LAB — CBC WITH DIFFERENTIAL (CANCER CENTER ONLY)
Abs Immature Granulocytes: 0.02 10*3/uL (ref 0.00–0.07)
Basophils Absolute: 0 10*3/uL (ref 0.0–0.1)
Basophils Relative: 1 %
Eosinophils Absolute: 0.1 10*3/uL (ref 0.0–0.5)
Eosinophils Relative: 1 %
HCT: 44 % (ref 36.0–46.0)
Hemoglobin: 14.5 g/dL (ref 12.0–15.0)
Immature Granulocytes: 0 %
Lymphocytes Relative: 30 %
Lymphs Abs: 1.5 10*3/uL (ref 0.7–4.0)
MCH: 30.3 pg (ref 26.0–34.0)
MCHC: 33 g/dL (ref 30.0–36.0)
MCV: 92.1 fL (ref 80.0–100.0)
Monocytes Absolute: 0.4 10*3/uL (ref 0.1–1.0)
Monocytes Relative: 8 %
Neutro Abs: 3 10*3/uL (ref 1.7–7.7)
Neutrophils Relative %: 60 %
Platelet Count: 254 10*3/uL (ref 150–400)
RBC: 4.78 MIL/uL (ref 3.87–5.11)
RDW: 12.1 % (ref 11.5–15.5)
WBC Count: 5 10*3/uL (ref 4.0–10.5)
nRBC: 0 % (ref 0.0–0.2)

## 2020-09-04 LAB — CMP (CANCER CENTER ONLY)
ALT: 17 U/L (ref 0–44)
AST: 22 U/L (ref 15–41)
Albumin: 4.5 g/dL (ref 3.5–5.0)
Alkaline Phosphatase: 67 U/L (ref 38–126)
Anion gap: 7 (ref 5–15)
BUN: 13 mg/dL (ref 8–23)
CO2: 29 mmol/L (ref 22–32)
Calcium: 9.5 mg/dL (ref 8.9–10.3)
Chloride: 106 mmol/L (ref 98–111)
Creatinine: 0.93 mg/dL (ref 0.44–1.00)
GFR, Estimated: >60 mL/min (ref >60)
Glucose, Bld: 80 mg/dL (ref 70–99)
Potassium: 3.8 mmol/L (ref 3.5–5.1)
Sodium: 142 mmol/L (ref 135–145)
Total Bilirubin: 0.6 mg/dL (ref 0.3–1.2)
Total Protein: 7.5 g/dL (ref 6.5–8.1)

## 2020-09-04 MED ORDER — IOPAMIDOL (ISOVUE-300) INJECTION 61%
75.0000 mL | Freq: Once | INTRAVENOUS | Status: AC | PRN
  Administered 2020-09-04: 75 mL via INTRAVENOUS

## 2020-09-05 ENCOUNTER — Encounter: Payer: Self-pay | Admitting: Physician Assistant

## 2020-09-05 ENCOUNTER — Other Ambulatory Visit: Payer: Self-pay

## 2020-09-05 ENCOUNTER — Inpatient Hospital Stay (HOSPITAL_BASED_OUTPATIENT_CLINIC_OR_DEPARTMENT_OTHER): Payer: Managed Care, Other (non HMO) | Admitting: Physician Assistant

## 2020-09-05 VITALS — BP 140/89 | HR 67 | Temp 98.9°F | Resp 14 | Ht 64.0 in | Wt 128.1 lb

## 2020-09-05 DIAGNOSIS — Z85118 Personal history of other malignant neoplasm of bronchus and lung: Secondary | ICD-10-CM | POA: Diagnosis not present

## 2020-09-05 DIAGNOSIS — C3492 Malignant neoplasm of unspecified part of left bronchus or lung: Secondary | ICD-10-CM | POA: Diagnosis not present

## 2020-09-06 ENCOUNTER — Telehealth: Payer: Self-pay | Admitting: Internal Medicine

## 2020-09-06 ENCOUNTER — Telehealth: Payer: Self-pay | Admitting: Physician Assistant

## 2020-09-06 NOTE — Telephone Encounter (Signed)
Per 12/21 los. No changes made to pt's schedule.

## 2020-09-06 NOTE — Telephone Encounter (Signed)
Released records to dr. Allegra Grana for referral to [id number]   Release: [id number]

## 2020-09-12 ENCOUNTER — Other Ambulatory Visit: Payer: Self-pay

## 2020-09-12 ENCOUNTER — Ambulatory Visit (HOSPITAL_COMMUNITY): Payer: Managed Care, Other (non HMO)

## 2020-09-14 ENCOUNTER — Ambulatory Visit: Payer: Self-pay | Admitting: Internal Medicine

## 2021-09-17 IMAGING — CT CT CHEST W/ CM
2 of 4 series · 15 of 36 positions shown, 18 images · IV contrast (iopamidol)
Comparison: None available

CLINICAL DATA: Non-small cell lung cancer staging, follow-up
evaluation in a patient with history of LEFT lower lobectomy for
stage I a non-small cell lung cancer by report diagnosed in 5139.

EXAM:
CT CHEST WITH CONTRAST
TECHNIQUE: Multidetector CT imaging of the chest was performed during
intravenous contrast administration.
CONTRAST:  75mL HVGBMF-NII IOPAMIDOL (HVGBMF-NII) INJECTION 61%

[Series 2: chest 2.00 br40 s3 · axial · 0.61mm/px · z∈[+1535,+1765]mm · 12 of 137 slices shown, 15 images (1 of 2)]
[im 11/137  mediastinal]
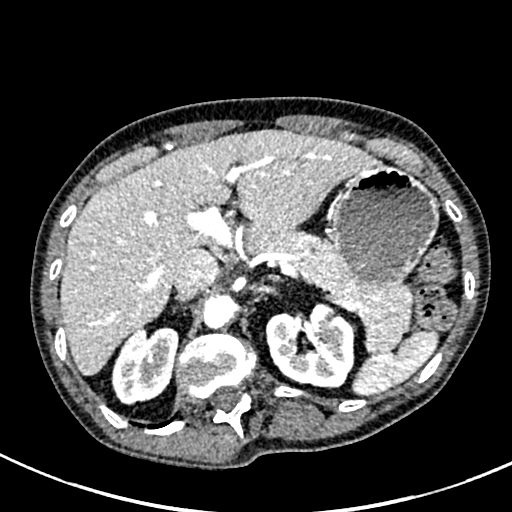
[im 11/137  lung]
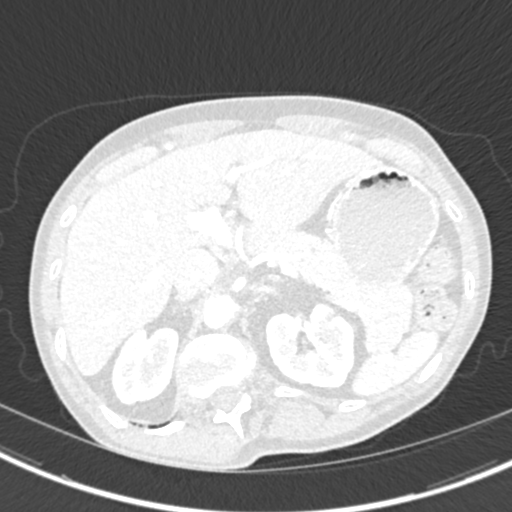
[im 21/137  lung]
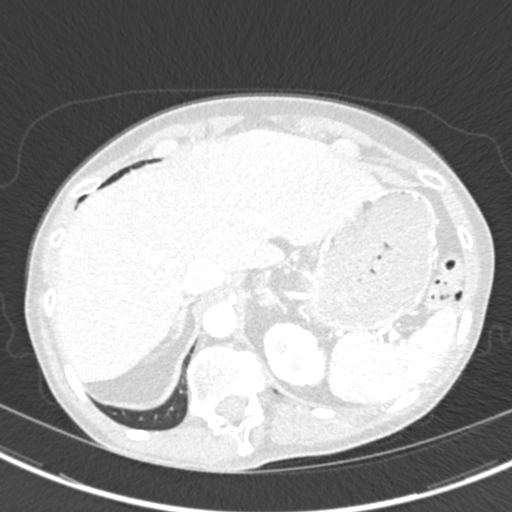
[im 32/137  lung]
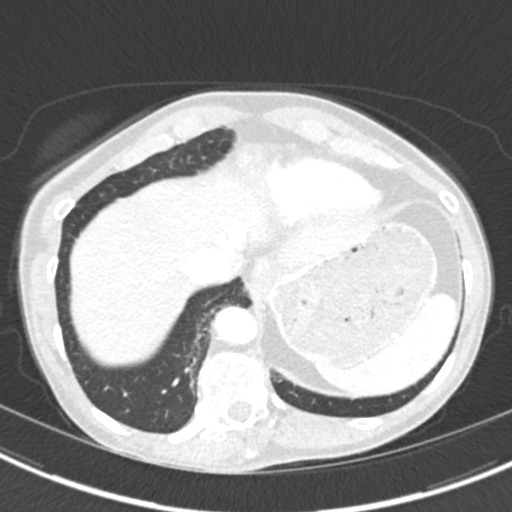
[im 42/137  lung]
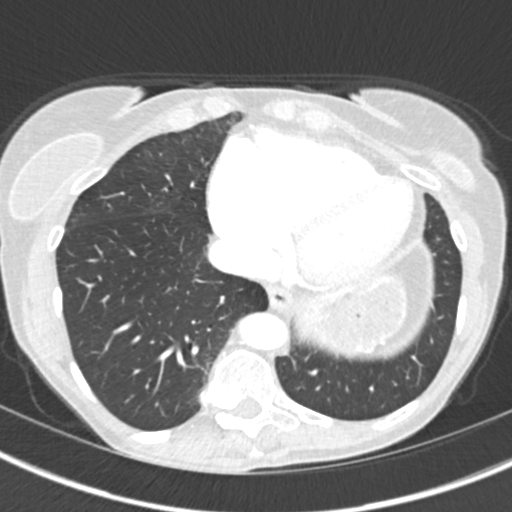
[im 53/137  mediastinal]
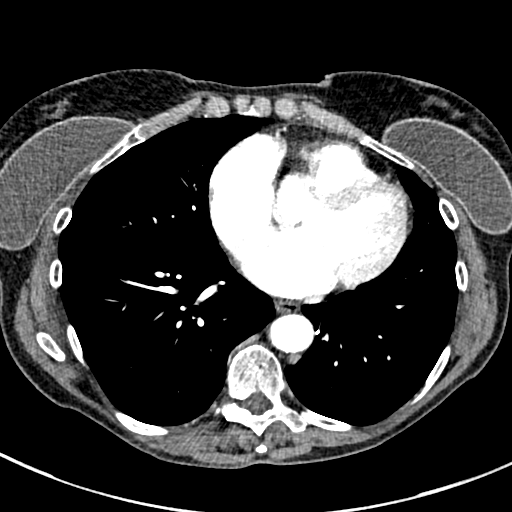
[im 53/137  lung]
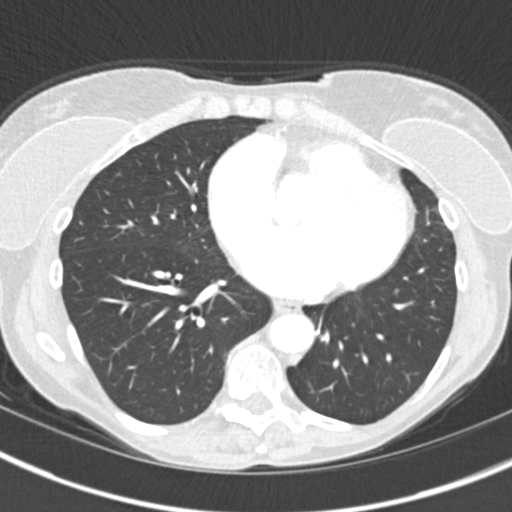
[im 63/137  lung]
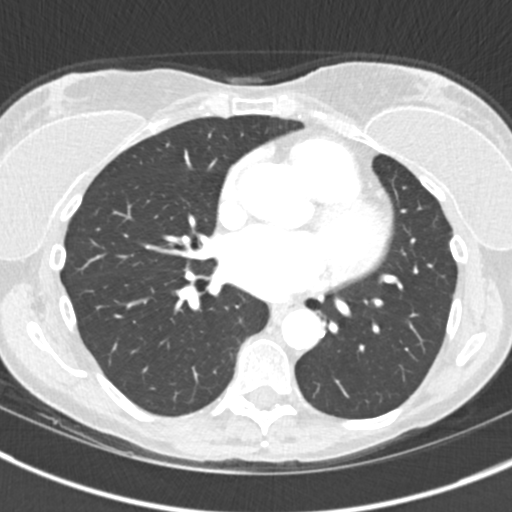
[im 74/137  lung]
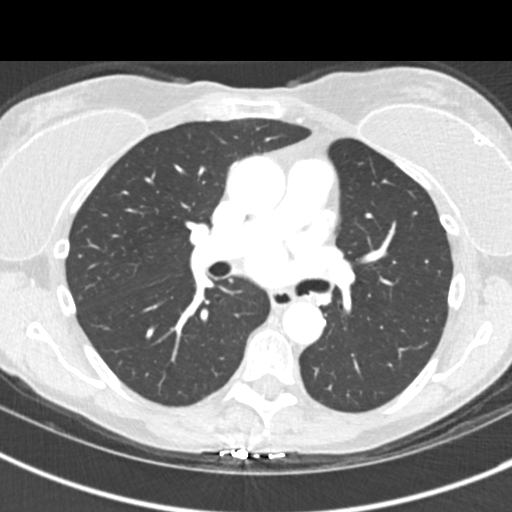
[im 84/137  lung]
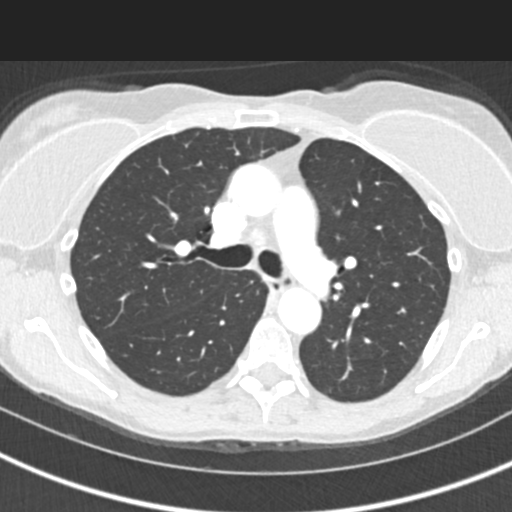
[im 95/137  mediastinal]
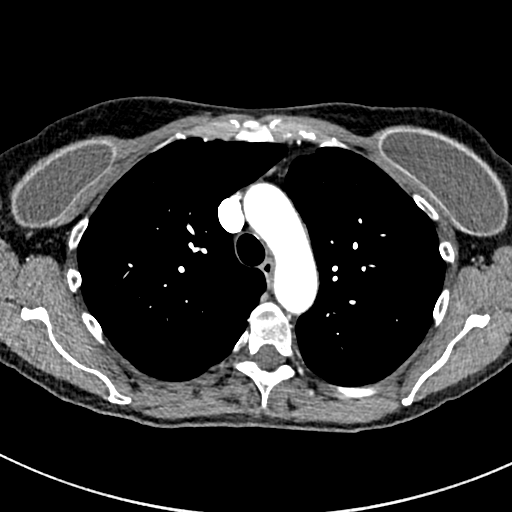
[im 95/137  lung]
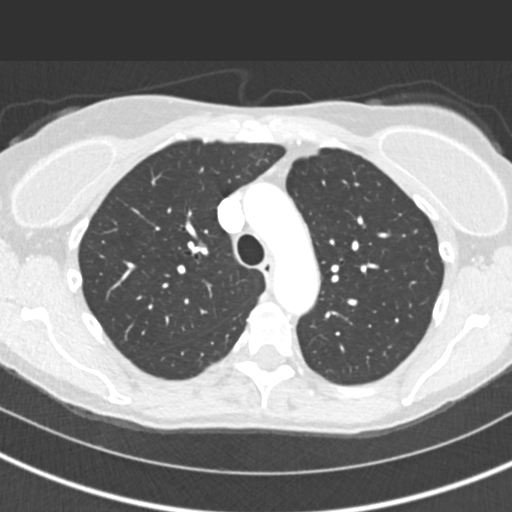
[im 105/137  lung]
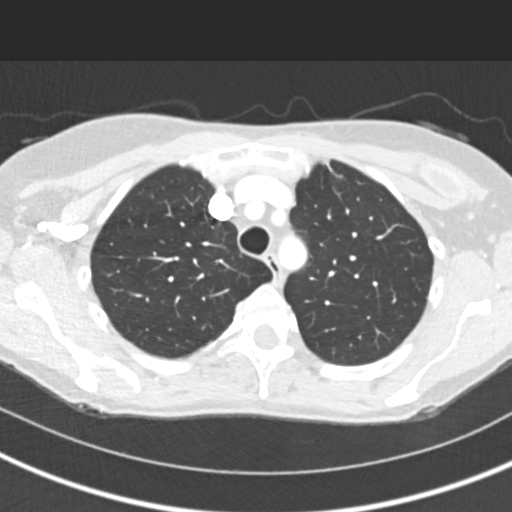
[im 116/137  lung]
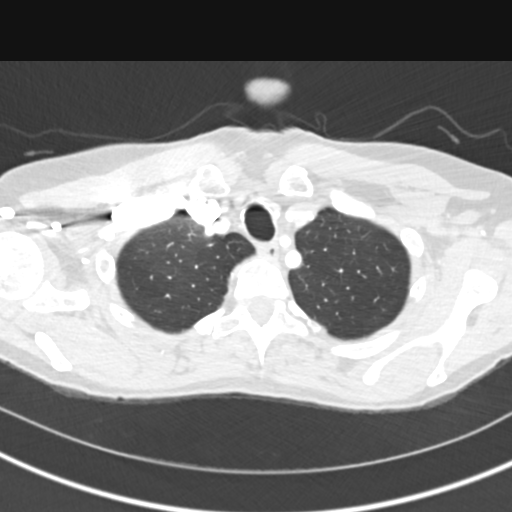
[im 126/137  lung]
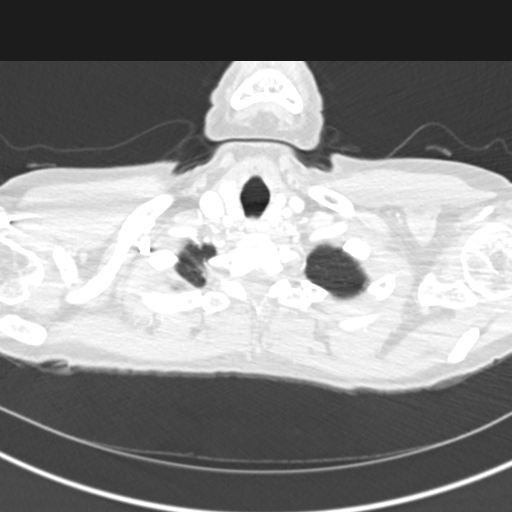

[Series 4: chest 2.00 br40 s3 · coronal · 0.54mm/px · 3 of 156 slices shown (2 of 2)]
[im 32/156  lung]
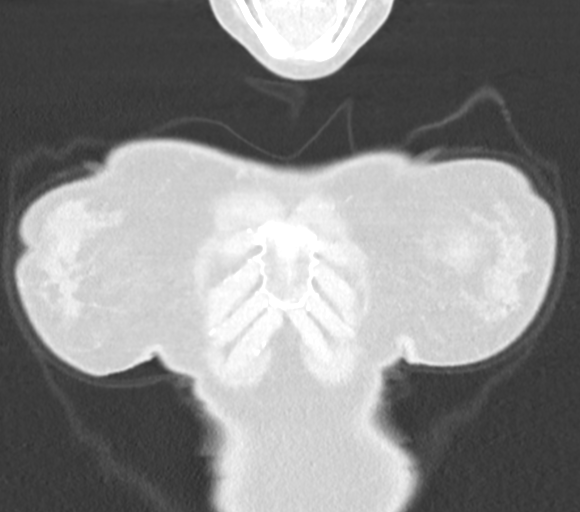
[im 63/156  lung]
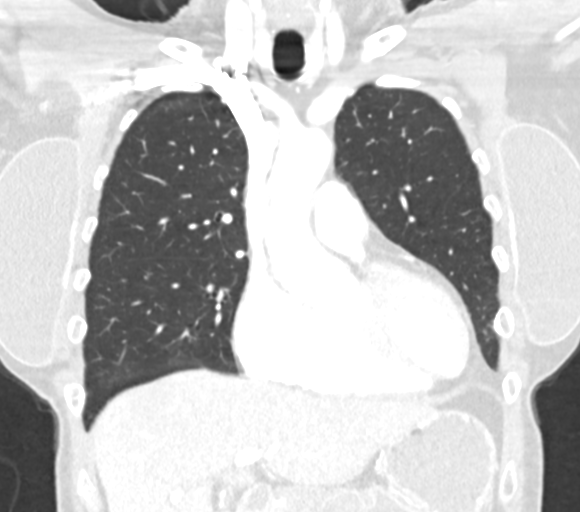
[im 94/156  lung]
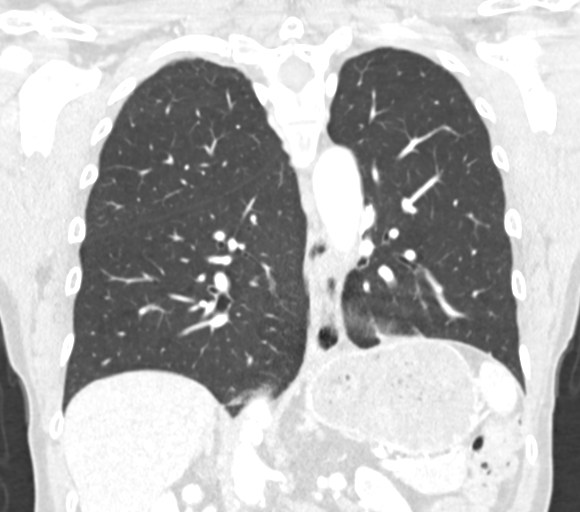

[15 of 36 positions shown; findings below may reference images not displayed]

FINDINGS: Cardiovascular: Calcified atheromatous plaque scattered throughout
the thoracic aorta. Ascending thoracic aortic caliber 3.5 cm.
Central pulmonary vasculature of normal caliber with alteration of
anatomy secondary to LEFT lower lobectomy. Heart size is normal
without pericardial effusion.

Mediastinum/Nodes: Esophagus mildly patulous. Thoracic inlet
structures are normal. No axillary lymphadenopathy. No mediastinal
lymphadenopathy. No hilar lymphadenopathy.

Lungs/Pleura: Post LEFT lower lobectomy. Minimal subpleural
reticulation in the RIGHT chest about the periphery of the RIGHT
lower lobe. Subtle nodular ground-glass in the medial RIGHT middle
lobe, largest area is approximately 6 x 4 mm. There is some motion
in this area the does limit assessment.

LEFT upper lobe nodule (image 90, series [DATE] x 4 mm. No effusion.
No consolidative changes.

Upper Abdomen: Hepatic cyst. No acute upper abdominal process.
Imaged portions of liver, spleen, pancreas and adrenal glands are
normal. Upper pole of LEFT and RIGHT kidney unremarkable.

Musculoskeletal: No acute musculoskeletal finding. Rotary
dextroconvex scoliotic curvature in the upper lumbar spine is
moderate and associated with degenerative changes, partially imaged.
IMPRESSION: 1. Post LEFT lower lobectomy.
2. Subtle nodular areas with ground-glass features in the medial
RIGHT middle lobe and in the anterior LEFT upper lobe in the mid
chest. Findings could be post infectious or inflammatory though
there are no prior images available for current comparison and these
areas remain indeterminate. Short interval follow-up in 3 months
and/or comparison with previous imaging may be helpful for further
assessment.
3. Atherosclerosis.

Aortic Atherosclerosis ([id number]).
